# Patient Record
Sex: Male | Born: 1965 | Race: White | Hispanic: No | Marital: Single
Health system: Southern US, Community
[De-identification: ages and names within clinical notes are randomized; demographics above are authoritative.]

---

## 2015-07-13 ENCOUNTER — Ambulatory Visit (INDEPENDENT_AMBULATORY_CARE_PROVIDER_SITE_OTHER): Payer: Managed Care, Other (non HMO) | Admitting: Family Medicine

## 2015-07-13 ENCOUNTER — Ambulatory Visit (INDEPENDENT_AMBULATORY_CARE_PROVIDER_SITE_OTHER): Payer: Self-pay

## 2015-07-13 VITALS — BP 129/75 | HR 104 | Ht 72.0 in | Wt 271.0 lb

## 2015-07-13 DIAGNOSIS — S4991XA Unspecified injury of right shoulder and upper arm, initial encounter: Secondary | ICD-10-CM | POA: Diagnosis not present

## 2015-07-13 MED ORDER — LORAZEPAM 0.5 MG PO TABS: ORAL_TABLET | ORAL | Status: AC

## 2015-07-13 NOTE — Patient Instructions (Signed)
Thank you for coming in today. Get xray today.  Continue exercises.  You should have MRI Monday.  Take ativan 30-60 mins prior to MRI.  Make sure you have someone to drive you to the MRI and home.
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
## 2015-07-14 NOTE — Progress Notes (Signed)
Quick Note:  Mild arthritis is present. No fracture seen. ______

## 2015-07-14 NOTE — Progress Notes (Signed)
sent note via epic to:  Vivia BudgeLilley, John David, MD     [address]    [place], [place] [address]    [phone]    [phone] (Fax)

## 2015-07-26 ENCOUNTER — Ambulatory Visit (INDEPENDENT_AMBULATORY_CARE_PROVIDER_SITE_OTHER): Payer: Managed Care, Other (non HMO)

## 2015-07-26 DIAGNOSIS — S46811D Strain of other muscles, fascia and tendons at shoulder and upper arm level, right arm, subsequent encounter: Secondary | ICD-10-CM | POA: Diagnosis not present

## 2015-07-26 DIAGNOSIS — S4991XA Unspecified injury of right shoulder and upper arm, initial encounter: Secondary | ICD-10-CM

## 2015-07-29 ENCOUNTER — Ambulatory Visit (INDEPENDENT_AMBULATORY_CARE_PROVIDER_SITE_OTHER): Payer: Managed Care, Other (non HMO) | Admitting: Family Medicine

## 2015-07-29 VITALS — BP 175/93 | HR 100 | Wt 271.0 lb

## 2015-07-29 DIAGNOSIS — S4991XA Unspecified injury of right shoulder and upper arm, initial encounter: Secondary | ICD-10-CM

## 2015-07-29 DIAGNOSIS — M75101 Unspecified rotator cuff tear or rupture of right shoulder, not specified as traumatic: Secondary | ICD-10-CM

## 2015-07-29 DIAGNOSIS — M75100 Unspecified rotator cuff tear or rupture of unspecified shoulder, not specified as traumatic: Secondary | ICD-10-CM | POA: Insufficient documentation

## 2015-07-29 DIAGNOSIS — M25511 Pain in right shoulder: Secondary | ICD-10-CM

## 2015-07-29 DIAGNOSIS — M25519 Pain in unspecified shoulder: Secondary | ICD-10-CM | POA: Insufficient documentation
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
## 2015-07-29 NOTE — Patient Instructions (Signed)
Thank you for coming in today. You should hear from Dr Preston Fleeting office soon.  Return as needed.  Get a CD made of your MRI pictures.    Surgery for Rotator Cuff Tear With Rehab Rotator cuff surgery is only recommended for individuals who have experienced persistent disability for greater than 3 months of non-surgical (conservative) treatment. Surgery is not necessary but is recommended for individuals who experience difficulty completing daily activities or athletes who are unable to compete. Rotator cuff tears do not usually heal without surgical intervention. If left alone small rotator cuff tears usually become larger. Younger athletes who have a rotator cuff tear may be recommended for surgery without attempting conservative rehabilitation. The purpose of surgery is to regain function of the shoulder joint and eliminate pain associated with the injury. In addition to repairing the tendon tear, the surgery often removes a portion of the bony roof of the shoulder (acromion) as well as the chronically thickened and inflamed membrane below the acromion (subacromial bursa). REASONS NOT TO OPERATE   Infection of the shoulder.  Inability to complete a rehabilitation program.  Patients who have other conditions (emotional or psychological) conditions that contribute to their shoulder condition. RISKS AND COMPLICATIONS  Infection.  Re-tear of the rotator cuff tendons or muscles.  Shoulder stiffness and/or weakness.  Inability to compete in athletics.  Acromioclavicular (AC) joint paint.  Risks of surgery: infection, bleeding, nerve damage, or damage to surrounding tissues. TECHNIQUE There are different surgical procedures used to treat rotator cuff tears. The type of procedure depends on the extent of injury as well as the surgeon's preference. All of the surgical techniques for rotator cuff tears have the same goal of repairing the torn tendon, removing part of the acromion, and removing the  subacromial bursa. There are two main types of procedures: arthroscopic and open incision. Arthroscopic procedures are usually completed and you go home the same day as surgery (outpatient). These procedures use multiple small incisions in which tools and a video camera are placed to work on the shoulder. An electric shaver removes the bursa, then a power burr shaves down the portion of the acromion that places pressure on the rotator cuff. Finally the rotator cuff is sewed (sutured) back to the humeral head. Open incision procedures require a larger incision. The deltoid muscle is detached from the acromion and a ligament in the shoulder (coracoacromial) is cut in order for the surgeon to access the rotator cuff. The subacromial bursa is removed as well as part of the acromion to give the rotator cuff room to move freely. The torn tendon is then sutured to the humeral head. After the rotator cuff is repaired, then the deltoid is reattached and the incision is closed up.  RECOVERY   Post-operative care depends on the surgical technique and the preferences of your therapist.  Keep the wound clean and dry for the first 10 to 14 days after surgery.  Keep your shoulder and arm in the sling provided to you for as long as you have been instructed to.  You will be given pain medications by your caregiver.  Passive (without using muscles) shoulder movements may be begun immediately after surgery.  It is important to follow through with you rehabilitation program in order to have the best possible recovery. RETURN TO SPORTS   The rehabilitation period will depend on the sport and position you play as well as the success of the operation.  The minimum recovery period is 6 months.  You  must have regained complete shoulder motion and strength before returning to sports. SEEK IMMEDIATE MEDICAL CARE IF:   Any medications produce adverse side effects.  Any complications from surgery occur:  Pain,  numbness, or coldness in the extremity operated upon.  Discoloration of the nail beds (they become blue or gray) of the extremity operated upon.  Signs of infections (fever, pain, inflammation, redness, or persistent bleeding). EXERCISES  RANGE OF MOTION (ROM) AND STRETCHING EXERCISES - Rotator Cuff Tear, Surgery For These exercises may help you restore your elbow mobility once your physician has discontinued your immobilization period. Beginning these before your provider's approval may result in delayed healing. Your symptoms may resolve with or without further involvement from your physician, physical therapist or athletic trainer. While completing these exercises, remember:   Restoring tissue flexibility helps normal motion to return to the joints. This allows healthier, less painful movement and activity.  An effective stretch should be held for at least 30 seconds. A stretch should never be painful. You should only feel a gentle lengthening or release in the stretch. ROM - Pendulum   Bend at the waist so that your right / left arm falls away from your body. Support yourself with your opposite hand on a solid surface, such as a table or a countertop.  Your right / left arm should be perpendicular to the ground. If it is not perpendicular, you need to lean over farther. Relax the muscles in your right / left arm and shoulder as much as possible.  Gently sway your hips and trunk so they move your right / left arm without any use of your right / left shoulder muscles.  Progress your movements so that your right / left arm moves side to side, then forward and backward, and finally, both clockwise and counterclockwise.  Complete __________ repetitions in each direction. Many people use this exercise to relieve discomfort in their shoulder as well as to gain range of motion. Repeat __________ times. Complete this exercise __________ times per day. STRETCH - Flexion, Seated   Sit in a firm  chair so that your right / left forearm can rest on a table or on a table or countertop. Your right / left elbow should rest below the height of your shoulder so that your shoulder feels supported and not tense or uncomfortable.  Keeping your right / left shoulder relaxed, lean forward at your waist, allowing your right / left hand to slide forward. Bend forward until you feel a moderate stretch in your shoulder, but before you feel an increase in your pain.  Hold __________ seconds. Slowly return to your starting position. Repeat __________ times. Complete this exercise __________ times per day.  STRETCH - Flexion, Standing   Stand with good posture. With an underhand grip on your right / left and an overhand grip on the opposite hand, grasp a broomstick or cane so that your hands are a little more than shoulder-width apart.  Keeping your right / left elbow straight and shoulder muscles relaxed, push the stick with your opposite hand to raise your right / left arm in front of your body and then overhead. Raise your arm until you feel a stretch in your right / left shoulder, but before you have increased shoulder pain.  Try to avoid shrugging your right / left shoulder as your arm rises by keeping your shoulder blade tucked down and toward your mid-back spine. Hold __________ seconds.  Slowly return to the starting position. Repeat __________  times. Complete this exercise __________ times per day.  STRETCH - Abduction, Supine   Stand with good posture. With an underhand grip on your right / left and an overhand grip on the opposite hand, grasp a broomstick or cane so that your hands are a little more than shoulder-width apart.  Keeping your right / left elbow straight and shoulder muscles relaxed, push the stick with your opposite hand to raise your right / left arm out to the side of your body and then overhead. Raise your arm until you feel a stretch in your right / left shoulder, but before  you have increased shoulder pain.  Try to avoid shrugging your right / left shoulder as your arm rises by keeping your shoulder blade tucked down and toward your mid-back spine. Hold __________ seconds.  Slowly return to the starting position. Repeat __________ times. Complete this exercise __________ times per day.  ROM - Flexion, Active-Assisted  Lie on your back. You may bend your knees for comfort.  Grasp a broomstick or cane so your hands are about shoulder-width apart. Your right / left hand should grip the end of the stick/cane so that your hand is positioned "thumbs-up," as if you were about to shake hands.  Using your healthy arm to lead, raise your right / left arm overhead until you feel a gentle stretch in your shoulder. Hold __________ seconds.  Use the stick/cane to assist in returning your right / left arm to its starting position. Repeat __________ times. Complete this exercise __________ times per day.  STRETCH - External Rotation   Tuck a folded towel or small ball under your right / left upper arm. Grasp a broomstick or cane with an underhand grasp a little more than shoulder width apart. Bend your elbows to 90 degrees.  Stand with good posture or sit in a chair without arms.  Use your strong arm to push the stick across your body. Do not allow the towel or ball to fall. This will rotate your right / left arm away from your abdomen. Using the stick turn/rotate your hand and forearm away from your body. Hold __________ seconds. Repeat __________ times. Complete this exercise __________ times per day.  STRENGTHENING EXERCISES - Rotator Cuff Tear, Surgery For These exercises may help you begin to restore your elbow strength in the initial stage of your rehabilitation. Your physician will determine when you begin these exercises depending on the severity of your injury and the integrity of your repaired tissues. Beginning these before your provider's approval may result in  delayed healing. While completing these exercises, remember:   Muscles can gain both the endurance and the strength needed for everyday activities through controlled exercises.  Complete these exercises as instructed by your physician, physical therapist or athletic trainer. Progress the resistance and repetitions only as guided.  You may experience muscle soreness or fatigue, but the pain or discomfort you are trying to eliminate should never worsen during these exercises. If this pain does worsen, stop and make certain you are following the directions exactly. If the pain is still present after adjustments, discontinue the exercise until you can discuss the trouble with your clinician. STRENGTH - Shoulder Flexion, Isometric   With good posture and facing a wall, stand or sit about 4-6 inches away.  Keeping your right / left elbow straight, gently press the top of your fist into the wall. Increase the pressure gradually until you are pressing as hard as you can without shrugging your  shoulder or increasing any shoulder discomfort.  Hold __________ seconds. Release the tension slowly. Relax your shoulder muscles completely before you do the next repetition. Repeat __________ times. Complete this exercise __________ times per day.  STRENGTH - Shoulder Abductors, Isometric   With good posture, stand or sit about 4-6 inches from a wall with your right / left side facing the wall.  Bend your right / left elbow. Gently press your right / left elbow into the wall. Increase the pressure gradually until you are pressing as hard as you can without shrugging your shoulder or increasing any shoulder discomfort.  Hold __________ seconds.  Release the tension slowly. Relax your shoulder muscles completely before you do the next repetition. Repeat __________ times. Complete this exercise __________ times per day.  STRENGTH - Internal Rotators, Isometric   Keep your right / left elbow at your side and  bend it 90 degrees.  Step into a door frame so that the inside of your right / left wrist can press against the door frame without your upper arm leaving your side.  Gently press your right / left wrist into the door frame as if you were trying to draw the palm of your hand to your abdomen. Gradually increase the tension until you are pressing as hard as you can without shrugging your shoulder or increasing any shoulder discomfort.  Hold __________ seconds.  Release the tension slowly. Relax your shoulder muscles completely before you do the next repetition. Repeat __________ times. Complete this exercise __________ times per day.  STRENGTH - External Rotators, Isometric   Keep your right / left elbow at your side and bend it 90 degrees.  Step into a door frame so that the outside of your right / left wrist can press against the door frame without your upper arm leaving your side.  Gently press your right / left wrist into the door frame as if you were trying to swing the back of your hand away from your abdomen. Gradually increase the tension until you are pressing as hard as you can without shrugging your shoulder or increasing any shoulder discomfort.  Hold __________ seconds.  Release the tension slowly. Relax your shoulder muscles completely before you do the next repetition. Repeat __________ times. Complete this exercise __________ times per day.    This information is not intended to replace advice given to you by your health care provider. Make sure you discuss any questions you have with your health care provider.   Document Released: 12/19/2004 Document Revised: 05/05/2014 Document Reviewed: 04/02/2008 Elsevier Interactive Patient Education [employer]

## 2015-08-02 ENCOUNTER — Telehealth: Payer: Self-pay

## 2015-08-02 NOTE — Telephone Encounter (Signed)
When the patient like me to try somebody who is noting Mark Stuart that we'll likely have more availability? I recommend Dr. Ave Filter at [facility] orth[facility] in [place].

## 2015-08-02 NOTE — Telephone Encounter (Signed)
Pt called stating that he will not be able to Dr. Corinna Capra until the end of august and that you advised him to let us know if it the appointment was far out. Advised pt that I would let you and call back with recommendation. Please advise.

## 2015-08-03 NOTE — Telephone Encounter (Signed)
Called pt. No answer, no VM. Will call back later.
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
